# Patient Record
Sex: Female | Born: 1975 | Race: Black or African American | Hispanic: No | Marital: Single | Smoking: Current every day smoker
Health system: Southern US, Community
[De-identification: ages and names within clinical notes are randomized; demographics above are authoritative.]

---

## 2012-03-22 ENCOUNTER — Encounter (HOSPITAL_COMMUNITY): Payer: Self-pay | Admitting: Emergency Medicine

## 2012-03-22 ENCOUNTER — Emergency Department (INDEPENDENT_AMBULATORY_CARE_PROVIDER_SITE_OTHER)
Admission: EM | Admit: 2012-03-22 | Discharge: 2012-03-22 | Disposition: A | Discharge status: Home / Self Care | Payer: Self-pay | Source: Home / Self Care | Attending: Emergency Medicine | Admitting: Emergency Medicine

## 2012-03-22 DIAGNOSIS — K0530 Chronic periodontitis, unspecified: Secondary | ICD-10-CM

## 2012-03-22 MED ORDER — PENICILLIN V POTASSIUM 500 MG PO TABS: 500.0000 mg | ORAL_TABLET | Freq: Four times a day (QID) | ORAL | Status: AC

## 2012-03-22 MED ORDER — HYDROCODONE-ACETAMINOPHEN 5-325 MG PO TABS
1.0000 | ORAL_TABLET | Freq: Once | ORAL | Status: AC
  Administered 2012-03-22: 1 via ORAL

## 2012-03-22 MED ORDER — DICLOFENAC SODIUM 75 MG PO TBEC: 75.0000 mg | DELAYED_RELEASE_TABLET | Freq: Two times a day (BID) | ORAL | Status: AC

## 2012-03-22 MED ORDER — HYDROCODONE-ACETAMINOPHEN 5-325 MG PO TABS
ORAL_TABLET | ORAL | Status: AC
  Filled 2012-03-22: qty 1

## 2012-03-22 MED ORDER — HYDROCODONE-ACETAMINOPHEN 5-325 MG PO TABS: ORAL_TABLET | ORAL | Status: AC

## 2012-03-22 NOTE — ED Provider Notes (Signed)
Chief Complaint  Patient presents with  . Dental Pain    History of Present Illness:   The patient is a 37 year old female who has had a five-day history of pain in the left, lower wisdom tooth with radiation towards her ear and into her jaw. She's had some headache and swelling of the gingiva, no swelling externally. There's been no purulent drainage. It does hurt to chew and swallow, but she's had no difficulty breathing. She denies any fever, chills, or shortness of breath.  Review of Systems:  Other than noted above, the patient denies any of the following symptoms: Systemic:  No fever, chills, sweats or weight loss. ENT:  No headache, ear ache, sore throat, nasal congestion, facial pain, or swelling. Lymphatic:  No adenopathy. Lungs:  No coughing, wheezing or shortness of breath.  PMFSH:  Past medical history, family history, social history, meds, and allergies were reviewed.  Physical Exam:   Vital signs:  BP 142/96  Pulse 99  Temp 98.6 F (37 C) (Oral)  Resp 16  SpO2 100%  LMP 03/19/2012 General:  Alert, oriented, in no distress. ENT:  TMs and canals normal.  Nasal mucosa normal. Mouth exam:  Her left, lower wisdom tooth is inflamed and partially erupted. There is inflammation of the gingiva surrounding it. There is no purulent drainage or swelling of the gingiva. Her teeth are otherwise in fairly good repair. She's able to open her mouth fully. There is no swelling of the tongue or the floor the mouth. Neck:  No swelling or adenopathy. Lungs:  Breath sounds clear and equal bilaterally.  No wheezes, rales or rhonchi. Heart:  Regular rhythm.  No gallops or murmers. Skin:  Clear, warm and dry.  Course in Urgent Care Center:   She was given Norco 5/325 by mouth for pain.   Assessment:  The encounter diagnosis was Pericoronitis.  Plan:   1.  The following meds were prescribed:   New Prescriptions   DICLOFENAC (VOLTAREN) 75 MG EC TABLET    Take 1 tablet (75 mg total) by mouth  2 (two) times daily.   HYDROCODONE-ACETAMINOPHEN (NORCO/VICODIN) 5-325 MG PER TABLET    1 to 2 tabs every 4 to 6 hours as needed for pain.   PENICILLIN V POTASSIUM (VEETID) 500 MG TABLET    Take 1 tablet (500 mg total) by mouth 4 (four) times daily.   2.  The patient was instructed in symptomatic care and handouts were given. 3.  The patient was told to return if becoming worse in any way, if no better in 3 or 4 days, and given some red flag symptoms that would indicate earlier return, especially difficulty breathing. 4.  The patient was told to follow up with a dentist as soon as possible.    Reuben Likes, MD 03/22/12 2215

## 2012-03-22 NOTE — ED Notes (Signed)
Pt c/o dental pain x5 days Sx include: pain on left bottom molar, left ear pain,  Denies: fevers, vomiting, nauseas, diarrhea.  She is alert w/no signs of acute distress.

## 2013-02-02 ENCOUNTER — Encounter: Payer: Self-pay | Admitting: Internal Medicine

## 2013-02-02 ENCOUNTER — Ambulatory Visit: Payer: No Typology Code available for payment source | Attending: Internal Medicine | Admitting: Internal Medicine

## 2013-02-02 VITALS — BP 142/98 | HR 89 | Temp 98.5°F | Ht 65.0 in | Wt 215.0 lb

## 2013-02-02 DIAGNOSIS — I2699 Other pulmonary embolism without acute cor pulmonale: Secondary | ICD-10-CM

## 2013-02-02 LAB — COMPLETE METABOLIC PANEL WITH GFR
ALT: 9 U/L (ref 0–35)
BUN: 8 mg/dL (ref 6–23)
CO2: 29 mEq/L (ref 19–32)
Calcium: 9.8 mg/dL (ref 8.4–10.5)
Chloride: 103 mEq/L (ref 96–112)
Creat: 0.96 mg/dL (ref 0.50–1.10)
GFR, Est African American: 87 mL/min
GFR, Est Non African American: 76 mL/min
Glucose, Bld: 80 mg/dL (ref 70–99)

## 2013-02-02 LAB — LIPID PANEL: Total CHOL/HDL Ratio: 3 Ratio

## 2013-02-02 LAB — CBC WITH DIFFERENTIAL/PLATELET
Eosinophils Absolute: 0.1 10*3/uL (ref 0.0–0.7)
Eosinophils Relative: 2 % (ref 0–5)
HCT: 37.3 % (ref 36.0–46.0)
Lymphocytes Relative: 35 % (ref 12–46)
Lymphs Abs: 2.1 10*3/uL (ref 0.7–4.0)
MCH: 25.6 pg — ABNORMAL LOW (ref 26.0–34.0)
MCV: 80.4 fL (ref 78.0–100.0)
Monocytes Absolute: 0.4 10*3/uL (ref 0.1–1.0)
Monocytes Relative: 7 % (ref 3–12)
RBC: 4.64 MIL/uL (ref 3.87–5.11)
WBC: 5.9 10*3/uL (ref 4.0–10.5)

## 2013-02-02 LAB — TSH: TSH: 2.417 u[IU]/mL (ref 0.350–4.500)

## 2013-02-02 NOTE — Progress Notes (Unsigned)
Patient ID: Brittany Salas, female   DOB: 11-12-1975, 37 y.o.   MRN: [id number]   CC:  HPI: 37 yr old presents with a hx of bilateral PE last year  She took xarelto for 10 days and then could not afford to continue it,She has had intermittent SOB , and continue to feel short winded  She had an abnormal pap smear in [place] last year but has not had any follow up since due to lack of insurance  She has a family hx of blood clots in her mother  Her HG 3 months ago was 7.2, she denies heavy menstruation or melena ,     No Known Allergies History reviewed. No pertinent past medical history. Current Outpatient Prescriptions on File Prior to Visit  Medication Sig Dispense Refill  . diclofenac (VOLTAREN) 75 MG EC tablet Take 1 tablet (75 mg total) by mouth 2 (two) times daily.  20 tablet  0  . HYDROcodone-acetaminophen (NORCO/VICODIN) 5-325 MG per tablet 1 to 2 tabs every 4 to 6 hours as needed for pain.  20 tablet  0  . penicillin v potassium (VEETID) 500 MG tablet Take 1 tablet (500 mg total) by mouth 4 (four) times daily.  40 tablet  0   No current facility-administered medications on file prior to visit.   Family History  Problem Relation Age of Onset  . Asthma Mother   . Hypertension Maternal Aunt   . Diabetes Maternal Grandmother    History   Social History  . Marital Status: Single    Spouse Name: N/A    Number of Children: N/A  . Years of Education: N/A   Occupational History  . Not on file.   Social History Main Topics  . Smoking status: Current Every Day Smoker -- 0.25 packs/day    Types: Cigarettes  . Smokeless tobacco: Not on file  . Alcohol Use: Yes  . Drug Use: No  . Sexual Activity: Not on file   Other Topics Concern  . Not on file   Social History Narrative  . No narrative on file    Review of Systems  Constitutional: Negative for fever, chills, diaphoresis, activity change, appetite change and fatigue.  HENT: Negative for ear pain, nosebleeds,  congestion, facial swelling, rhinorrhea, neck pain, neck stiffness and ear discharge.   Eyes: Negative for pain, discharge, redness, itching and visual disturbance.  Respiratory: Negative for cough, choking, chest tightness, shortness of breath, wheezing and stridor.   Cardiovascular: Negative for chest pain, palpitations and leg swelling.  Gastrointestinal: Negative for abdominal distention.  Genitourinary: Negative for dysuria, urgency, frequency, hematuria, flank pain, decreased urine volume, difficulty urinating and dyspareunia.  Musculoskeletal: Negative for back pain, joint swelling, arthralgias and gait problem.  Neurological: Negative for dizziness, tremors, seizures, syncope, facial asymmetry, speech difficulty, weakness, light-headedness, numbness and headaches.  Hematological: Negative for adenopathy. Does not bruise/bleed easily.  Psychiatric/Behavioral: Negative for hallucinations, behavioral problems, confusion, dysphoric mood, decreased concentration and agitation.    Objective:   Filed Vitals:   02/02/13 0917  BP: 142/98  Pulse: 89  Temp: 98.5 F (36.9 C)    Physical Exam  Constitutional: Appears well-developed and well-nourished. No distress.  HENT: Normocephalic. External right and left ear normal. Oropharynx is clear and moist.  Eyes: Conjunctivae and EOM are normal. PERRLA, no scleral icterus.  Neck: Normal ROM. Neck supple. No JVD. No tracheal deviation. No thyromegaly.  CVS: RRR, S1/S2 +, no murmurs, no gallops, no carotid bruit.  Pulmonary: Effort and breath  sounds normal, no stridor, rhonchi, wheezes, rales.  Abdominal: Soft. BS +,  no distension, tenderness, rebound or guarding.  Musculoskeletal: Normal range of motion. No edema and no tenderness.  Lymphadenopathy: No lymphadenopathy noted, cervical, inguinal. Neuro: Alert. Normal reflexes, muscle tone coordination. No cranial nerve deficit. Skin: Skin is warm and dry. No rash noted. Not diaphoretic. No  erythema. No pallor.  Psychiatric: Normal mood and affect. Behavior, judgment, thought content normal.   No results found for this basename: WBC, HGB, HCT, MCV, PLT   No results found for this basename: CREATININE, BUN, NA, K, CL, CO2    No results found for this basename: HGBA1C   Lipid Panel  No results found for this basename: chol, trig, hdl, cholhdl, vldl, ldlcalc       Assessment and plan:   There are no active problems to display for this patient.      Bilateral PE Patients wants to repeat Ct scan before resuming blood thinner  Will check CTA and doppler b/l LE hypercoaguable panel  Gyne referral  For abnormal pap smear last year  Dentist referral provided because of a recent dental abscess  Follow up in 3-4 days after CT angio  The patient was given clear instructions to go to ER or return to medical center if symptoms don't improve, worsen or new problems develop. The patient verbalized understanding. The patient was told to call to get any lab results if not heard anything in the next week.

## 2013-02-02 NOTE — Progress Notes (Unsigned)
Pt is here to establish care. Pt complains of shortness of breath for 2 months. Recently visited [place] when the shortness of breathe first occurred. Had a chest xray performed. Pt requests to be referred to a dentist, a gynecologist and for another chest xray to be performed.

## 2013-02-03 LAB — VITAMIN D 25 HYDROXY (VIT D DEFICIENCY, FRACTURES): Vit D, 25-Hydroxy: 21 ng/mL — ABNORMAL LOW (ref 30–89)

## 2013-02-04 ENCOUNTER — Ambulatory Visit (HOSPITAL_COMMUNITY)
Admission: RE | Admit: 2013-02-04 | Discharge: 2013-02-04 | Disposition: A | Discharge status: Home / Self Care | Payer: No Typology Code available for payment source | Source: Ambulatory Visit | Attending: Internal Medicine | Admitting: Internal Medicine

## 2013-02-04 DIAGNOSIS — I2699 Other pulmonary embolism without acute cor pulmonale: Secondary | ICD-10-CM | POA: Insufficient documentation

## 2013-02-04 DIAGNOSIS — R0602 Shortness of breath: Secondary | ICD-10-CM | POA: Insufficient documentation

## 2013-02-04 LAB — HYPERCOAGULABLE PANEL, COMPREHENSIVE
Anticardiolipin IgG: 12 GPL U/mL (ref <23)
Anticardiolipin IgM: 5 MPL U/mL (ref <11)
Beta-2 Glyco I IgG: 6 G Units (ref <20)
Beta-2-Glycoprotein I IgA: 0 A Units (ref <20)
Lupus Anticoagulant: NOT DETECTED
PTT Lupus Anticoagulant: 35.3 secs (ref 28.0–43.0)
Protein C, Total: 67 % — ABNORMAL LOW (ref 72–160)
Protein S Total: 83 % (ref 60–150)

## 2013-02-04 MED ORDER — IOHEXOL 350 MG/ML SOLN
75.0000 mL | Freq: Once | INTRAVENOUS | Status: AC | PRN
  Administered 2013-02-04: 75 mL via INTRAVENOUS

## 2013-02-08 ENCOUNTER — Ambulatory Visit: Payer: No Typology Code available for payment source | Attending: Internal Medicine | Admitting: Internal Medicine

## 2013-02-08 ENCOUNTER — Encounter: Payer: Self-pay | Admitting: Internal Medicine

## 2013-02-08 VITALS — BP 137/94 | HR 92 | Temp 98.6°F | Resp 16 | Ht 63.0 in | Wt 218.0 lb

## 2013-02-08 DIAGNOSIS — I2699 Other pulmonary embolism without acute cor pulmonale: Secondary | ICD-10-CM | POA: Insufficient documentation

## 2013-02-08 MED ORDER — RIVAROXABAN 20 MG PO TABS: 20.0000 mg | ORAL_TABLET | Freq: Every day | ORAL | Status: DC

## 2013-02-08 NOTE — Progress Notes (Signed)
Patient ID: Brittany Salas, female   DOB: 20-Jul-1975, 37 y.o.   MRN: [id number] Patient Demographics  Brittany Salas, is a 37 y.o. female  WUJ:[id number]  [id number]  DOB - August 16, 1975  Chief Complaint  Patient presents with  . Follow-up        Subjective:   Brittany Salas is a 37 y.o. female here today for a follow up visit. Patient here to discuss the result of her CTA chest and to begin her Xarelto. She has no complaint today, her shortness of breath is better.  Patient has No headache, No chest pain, No abdominal pain - No Nausea, No new weakness tingling or numbness, No Cough - SOB.  ALLERGIES: No Known Allergies  PAST MEDICAL HISTORY: History reviewed. No pertinent past medical history.  MEDICATIONS AT HOME: Prior to Admission medications   Medication Sig Start Date End Date Taking? Authorizing Provider  diclofenac (VOLTAREN) 75 MG EC tablet Take 1 tablet (75 mg total) by mouth 2 (two) times daily. 03/22/12   Reuben Likes, MD  HYDROcodone-acetaminophen (NORCO/VICODIN) 5-325 MG per tablet 1 to 2 tabs every 4 to 6 hours as needed for pain. 03/22/12   Reuben Likes, MD  penicillin v potassium (VEETID) 500 MG tablet Take 1 tablet (500 mg total) by mouth 4 (four) times daily. 03/22/12   Reuben Likes, MD  Rivaroxaban (XARELTO) 20 MG TABS tablet Take 1 tablet (20 mg total) by mouth daily with supper. 02/08/13   Jeanann Lewandowsky, MD     Objective:   Filed Vitals:   02/08/13 0923  BP: 137/94  Pulse: 92  Temp: 98.6 F (37 C)  TempSrc: Oral  Resp: 16  Height: 5\' 3"  (1.6 m)  Weight: 218 lb (98.884 kg)  SpO2: 99%    Exam General appearance : Awake, alert, not in any distress. Speech Clear. Not toxic looking HEENT: Atraumatic and Normocephalic, pupils equally reactive to light and accomodation Neck: supple, no JVD. No cervical lymphadenopathy.  Chest:Good air entry bilaterally, no added sounds  CVS: S1 S2 regular, no murmurs.  Abdomen: Bowel sounds present, Non  tender and not distended with no gaurding, rigidity or rebound. Extremities: B/L Lower Ext shows no edema, both legs are warm to touch Neurology: Awake alert, and oriented X 3, CN II-XII intact, Non focal Skin:No Rash Wounds:N/A   Data Review   CBC  Recent Labs Lab 02/02/13 0941  WBC 5.9  HGB 11.9*  HCT 37.3  PLT 404*  MCV 80.4  MCH 25.6*  MCHC 31.9  RDW 20.1*  LYMPHSABS 2.1  MONOABS 0.4  EOSABS 0.1  BASOSABS 0.0    Chemistries    Recent Labs Lab 02/02/13 0941  NA 138  K 4.1  CL 103  CO2 29  GLUCOSE 80  BUN 8  CREATININE 0.96  CALCIUM 9.8  AST 10  ALT 9  ALKPHOS 88  BILITOT 0.4   ------------------------------------------------------------------------------------------------------------------ No results found for this basename: HGBA1C,  in the last 72 hours ------------------------------------------------------------------------------------------------------------------ No results found for this basename: CHOL, HDL, LDLCALC, TRIG, CHOLHDL, LDLDIRECT,  in the last 72 hours ------------------------------------------------------------------------------------------------------------------ No results found for this basename: TSH, T4TOTAL, FREET3, T3FREE, THYROIDAB,  in the last 72 hours ------------------------------------------------------------------------------------------------------------------ No results found for this basename: VITAMINB12, FOLATE, FERRITIN, TIBC, IRON, RETICCTPCT,  in the last 72 hours  Coagulation profile  No results found for this basename: INR, PROTIME,  in the last 168 hours    Assessment & Plan   1. Acute pulmonary embolism: Resolving on CT  angiogram of chest  - Rivaroxaban (XARELTO) 20 MG TABS tablet; Take 1 tablet (20 mg total) by mouth daily with supper.  Dispense: 30 tablet; Refill: 0  Patient was counseled extensively about smoking cessation Patient was counseled about nutrition and exercise  Follow up in 4 weeks or  when necessary   The patient was given clear instructions to go to ER or return to medical center if symptoms don't improve, worsen or new problems develop. The patient verbalized understanding. The patient was told to call to get lab results if they haven't heard anything in the next week.    Jeanann Lewandowsky, MD, MHA, FACP, FAAP [facility] [place], [place] [phone]   02/08/2013, 10:01 AM

## 2013-02-08 NOTE — Patient Instructions (Signed)
Pulmonary Embolus A pulmonary (lung) embolus (PE) is a blood clot that has traveled from another place in the body to the lung. Most clots come from deep veins in the legs or pelvis. PE is a dangerous and potentially life-threatening condition that can be treated if identified. CAUSES Blood clots form in a vein for different reasons. Usually several things cause blood clots. They include:  The flow of blood slows down.  The inside of the vein is damaged in some way.  The person has a condition that makes the blood clot more easily. These conditions may include:  Older age (especially over 75 years old).  Having a history of blood clots.  Having major or lengthy surgery. Hip surgery is particularly high-risk.  Breaking a hip or leg.  Sitting or lying still for a long time.  Cancer or cancer treatment.  Having a long, thin tube (catheter) placed inside a vein during a medical procedure.  Being overweight (obese).  Pregnancy and childbirth.  Medicines with estrogen.  Smoking.  Other circulation or heart problems. SYMPTOMS  The symptoms of a PE usually start suddenly and include:  Shortness of breath.  Coughing.  Coughing up blood or blood-tinged mucus (phlegm).  Chest pain. Pain is often worse with deep breaths.  Rapid heartbeat. DIAGNOSIS  If a PE is suspected, your caregiver will take a medical history and carry out a physical exam. Your caregiver will check for the risk factors listed above. Tests that also may be required include:  Blood tests, including studies of the clotting properties of your blood.  Imaging tests. Ultrasound, CT, MRI, and other tests can all be used to see if you have clots in your legs or lungs. If you have a clot in your legs and have breathing or chest problems, your caregiver may conclude that you have a clot in your lungs. Further lung tests may not be needed.  Electrocardiography can look for heart strain from blood clots in the  lungs. PREVENTION   Exercise the legs regularly. Take a brisk 30 minute walk every day.  Maintain a weight that is appropriate for your height.  Avoid sitting or lying in bed for long periods of time without moving your legs.  Women, particularly those over the age of 35, should consider the risks and benefits of taking estrogen medicines, including birth control pills.  Do not smoke, especially if you take estrogen medicines.  Long-distance travel can increase your risk. You should exercise your legs by walking or pumping the muscles every hour.  In hospital prevention:  Your caregiver will assess your need for preventive PE care (prophylaxis) when you are admitted to the hospital. If you are having surgery, your surgeon will assess you the day of or day after surgery.  Prevention may include medical and nonmedical measures. TREATMENT   The most common treatment for a PE is blood thinning (anticoagulant) medicine, which reduces the blood's tendency to clot. Anticoagulants can stop new blood clots from forming and old ones from growing. They cannot dissolve existing clots. Your body does this by itself over time. Anticoagulants can be given by mouth, by intravenous (IV) access, or by injection. Your caregiver will determine the best program for you.  Less commonly, clot-dissolving drugs (thrombolytics) are used to dissolve a PE. They carry a high risk of bleeding, so they are used mainly in severe cases.  Very rarely, a blood clot in the leg needs to be removed surgically.  If you are unable to   take anticoagulants, your caregiver may arrange for you to have a filter placed in a main vein in your abdomen. This filter prevents clots from traveling to your lungs. HOME CARE INSTRUCTIONS   Take all medicines prescribed by your caregiver. Follow the directions carefully.  Warfarin. Most people will continue taking warfarin after hospital discharge. Your caregiver will advise you on the  length of treatment (usually 3 6 months, sometimes lifelong).  Too much and too little warfarin are both dangerous. Too much warfarin increases the risk of bleeding. Too little warfarin continues to allow the risk for blood clots. While taking warfarin, you will need to have regular blood tests to measure your blood clotting time. These blood tests usually include both the prothrombin time (PT) and International Normalized Ratio (INR) tests. The PT and INR results allow your caregiver to adjust your dose of warfarin. The dose can change for many reasons. It is critically important that you take warfarin exactly as prescribed, and that you have your PT and INR levels drawn exactly as directed.  Many foods, especially foods high in vitamin K can interfere with warfarin and affect the PT and INR results. Foods high in vitamin K include spinach, kale, broccoli, cabbage, collard and turnip greens, brussels sprouts, peas, cauliflower, seaweed, and parsley as well as beef and pork liver, green tea, and soybean oil. You should eat a consistent amount of foods high in vitamin K. Avoid major changes in your diet, or notify your caregiver before changing your diet. Arrange a visit with a dietitian to answer your questions.  Many medicines can interfere with warfarin and affect the PT and INR results. You must tell your caregiver about any and all medicines you take, this includes all vitamins and supplements. Be especially cautious with aspirin and anti-inflammatory medicines. Ask your caregiver before taking these. Do not take or discontinue any prescribed or over-the-counter medicine except on the advice of your caregiver or pharmacist.  Warfarin can have side effects, such as excessive bruising or bleeding. You will need to hold pressure over cuts for longer than usual.  Alcohol can change the body's ability to handle warfarin. It is best to avoid alcoholic drinks or consume only very small amounts while taking  warfarin. Notify your caregiver if you change your alcohol intake.  Notify your dentist or other caregivers before procedures.  Avoid contact sports.  Wear a medical alert bracelet or carry a medical alert card.  Ask your caregiver how soon you can go back to normal activities. Not being active can lead to new clots. Ask for a list of what you should and should not do.  Compression stockings. These are tight elastic stockings that apply pressure to the lower legs. This can help keep the blood in the legs from clotting. You may need to wear compressions stockings at home to help prevent clots.  Smoking. If you smoke, quit. Ask your caregiver for help with quitting smoking.  Learn as much as you can about PE. Educating yourself can help prevent PE from reoccurring. SEEK MEDICAL CARE IF:   You notice a rapid heartbeat.  You feel weaker or more tired than usual.  You feel faint.  You notice increased bruising.  Your symptoms are not getting better in the time expected.  You are having side effects of medicine. SEEK IMMEDIATE MEDICAL CARE IF:   You have chest pain.  You have trouble breathing.  You have new or increased swelling or pain in one leg.  You   cough up blood.  You notice blood in vomit, in a bowel movement, or in urine.  You have an oral temperature above 102 F (38.9 C), not controlled by medicine. You may have another PE. A blood clot in the lungs is a medical emergency. Call your local emergency services (911 in [place] to get to the nearest hospital or clinic. Do not drive yourself. MAKE SURE YOU:   Understand these instructions.  Will watch your condition.  Will get help right away if you are not doing well or get worse. Document Released: 02/29/2000 Document Revised: 09/02/2011 Document Reviewed: 09/04/2008 [employer] Patient Information 2014 [employer].  

## 2013-02-08 NOTE — Progress Notes (Signed)
Pt reports that she had blood clots in both lungs causing her SOB her symptoms has not changed.

## 2013-02-14 ENCOUNTER — Ambulatory Visit: Payer: Self-pay

## 2013-03-23 ENCOUNTER — Encounter: Payer: Self-pay | Admitting: Obstetrics & Gynecology

## 2013-03-30 ENCOUNTER — Other Ambulatory Visit: Payer: Self-pay | Admitting: Internal Medicine

## 2013-04-20 ENCOUNTER — Encounter: Payer: Self-pay | Admitting: Obstetrics & Gynecology

## 2013-04-20 ENCOUNTER — Encounter (INDEPENDENT_AMBULATORY_CARE_PROVIDER_SITE_OTHER): Payer: Self-pay | Admitting: Obstetrics & Gynecology

## 2013-04-22 ENCOUNTER — Encounter: Payer: Self-pay | Admitting: Obstetrics & Gynecology

## 2013-04-22 NOTE — Progress Notes (Signed)
Patient ID: Brittany BealFrancine Scow, female   DOB: [birth year], 38 y.o.   MRN: [id number] Pt not seen referred to free PAP clinic

## 2013-04-28 ENCOUNTER — Other Ambulatory Visit: Payer: Self-pay | Admitting: Internal Medicine

## 2013-05-11 ENCOUNTER — Ambulatory Visit: Payer: Self-pay | Admitting: Internal Medicine

## 2013-06-28 ENCOUNTER — Other Ambulatory Visit: Payer: Self-pay | Admitting: Internal Medicine

## 2013-06-28 DIAGNOSIS — I2699 Other pulmonary embolism without acute cor pulmonale: Secondary | ICD-10-CM

## 2013-08-07 ENCOUNTER — Other Ambulatory Visit: Payer: Self-pay | Admitting: Internal Medicine

## 2014-07-30 IMAGING — CT CT ANGIO CHEST
2 of 9 series · 19 of 46 positions shown · IV contrast (APPLIED)
Comparison: None.

CLINICAL DATA: Shortness of breath. History of prior pulmonary
embolism.

EXAM:
CT ANGIOGRAPHY CHEST WITH CONTRAST
TECHNIQUE: Multidetector CT imaging of the chest was performed using the
standard protocol during bolus administration of intravenous
contrast. Multiplanar CT image reconstructions including MIPs were
obtained to evaluate the vascular anatomy.
CONTRAST:  75mL OMNIPAQUE IOHEXOL 350 MG/ML SOLN

[Series 5: thins · axial · 0.60mm/px · z∈[+1212,+1410]mm · 16 of 226 slices shown]
[im 14/226  lung]
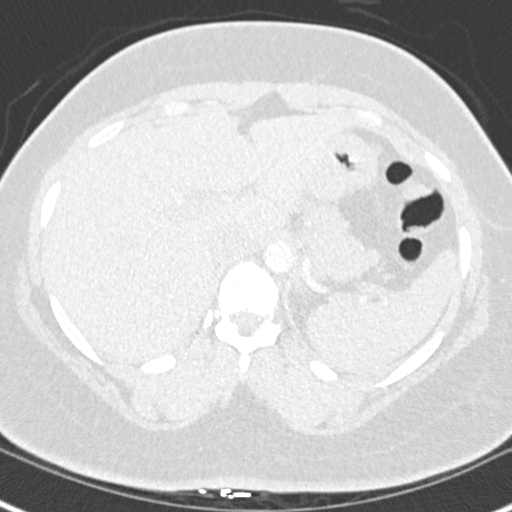
[im 27/226  soft-tissue]
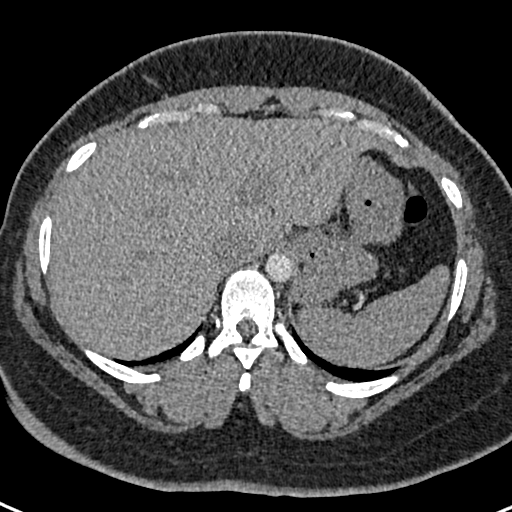
[im 40/226  lung]
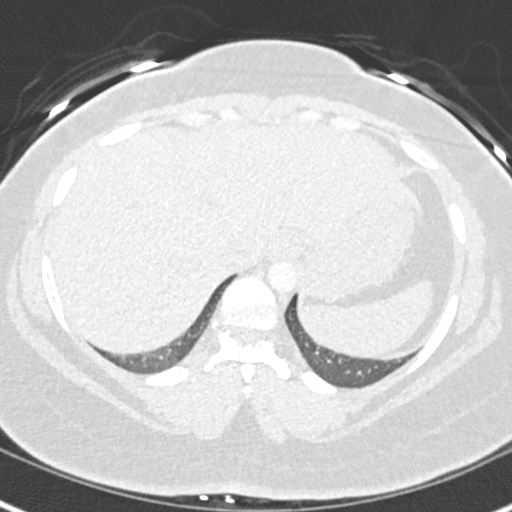
[im 53/226  soft-tissue]
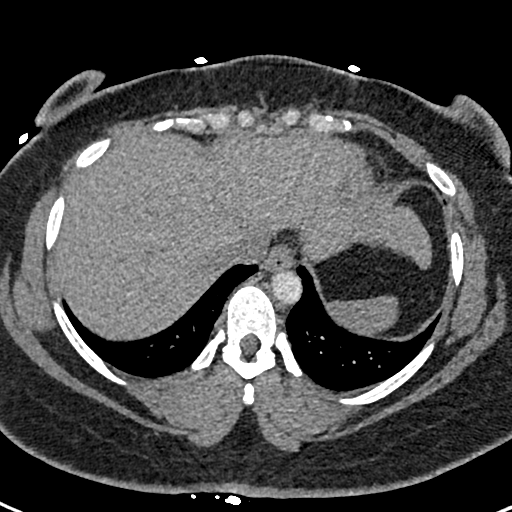
[im 67/226  lung]
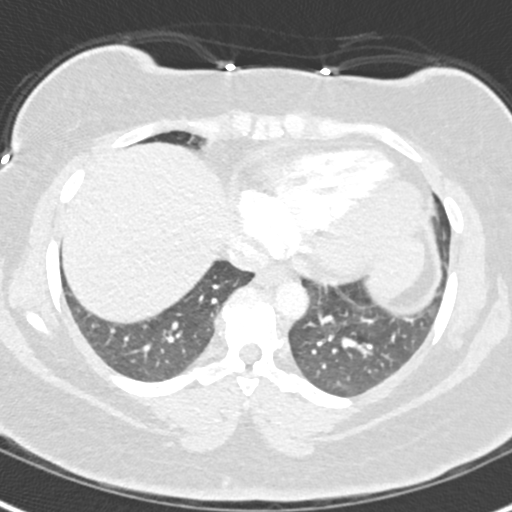
[im 80/226  soft-tissue]
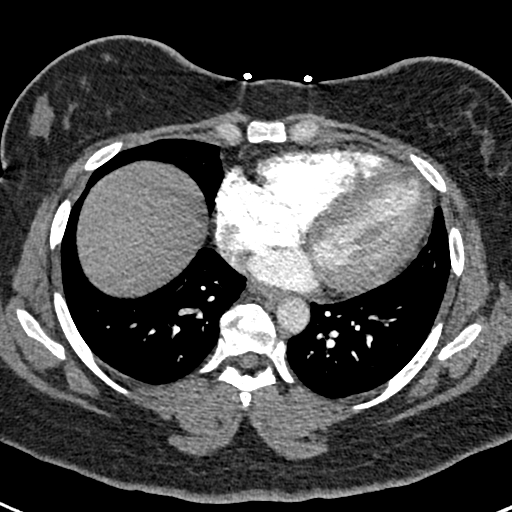
[im 93/226  lung]
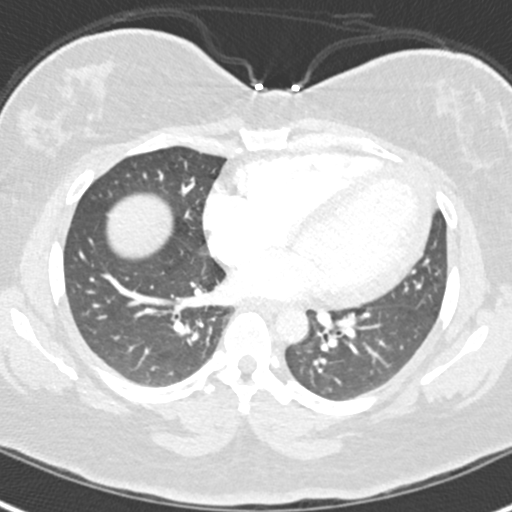
[im 106/226  soft-tissue]
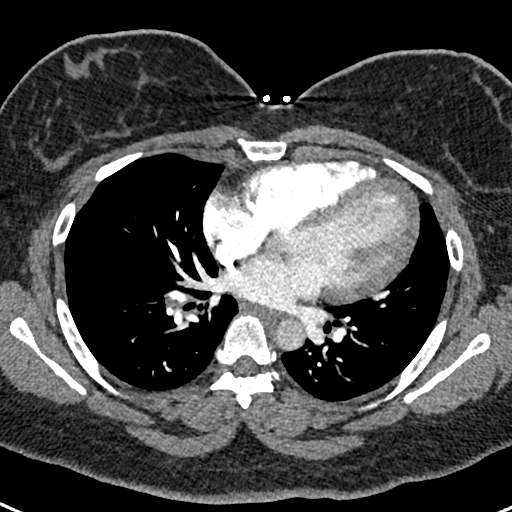
[im 120/226  lung]
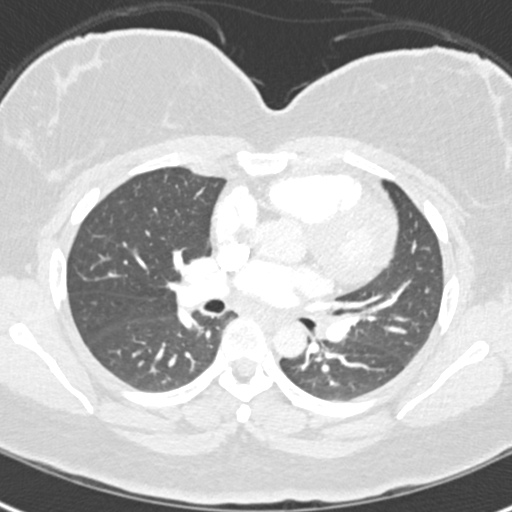
[im 133/226  soft-tissue]
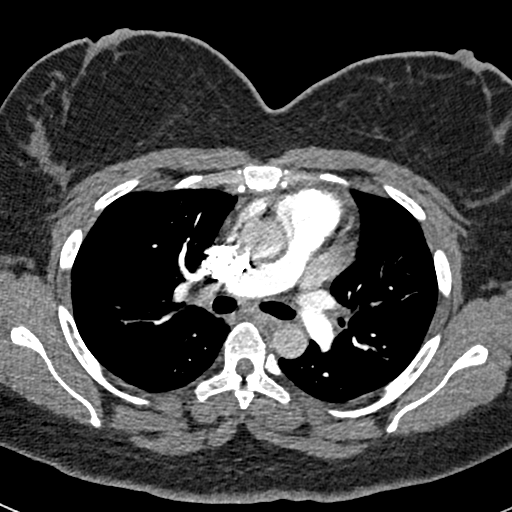
[im 146/226  lung]
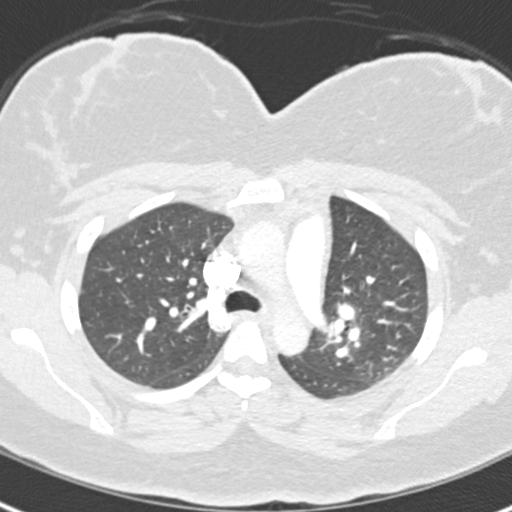
[im 159/226  soft-tissue]
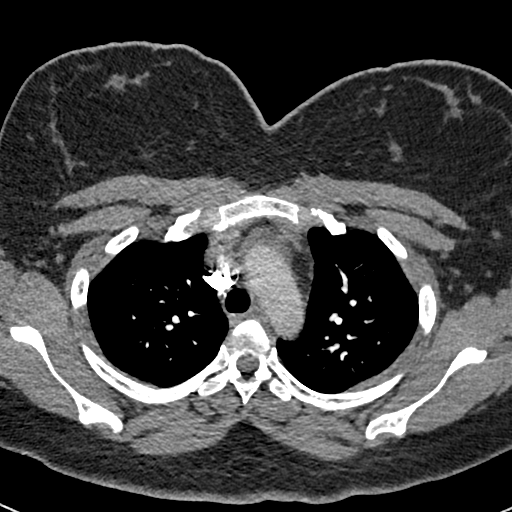
[im 173/226  lung]
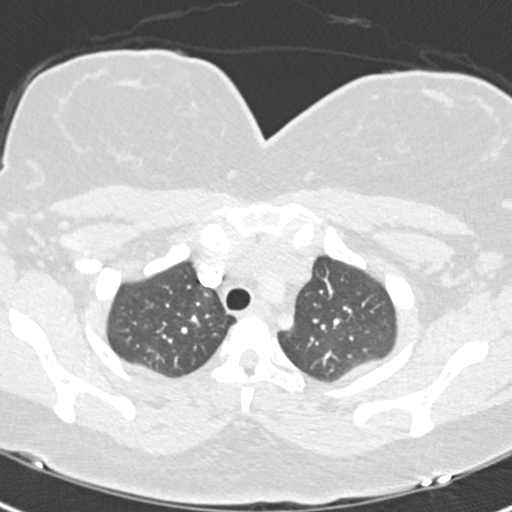
[im 186/226  soft-tissue]
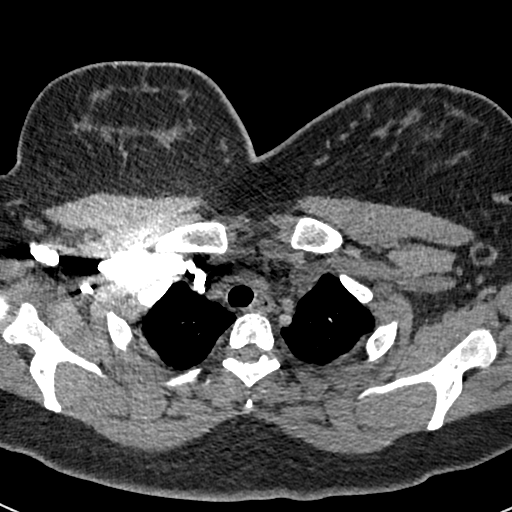
[im 199/226  lung]
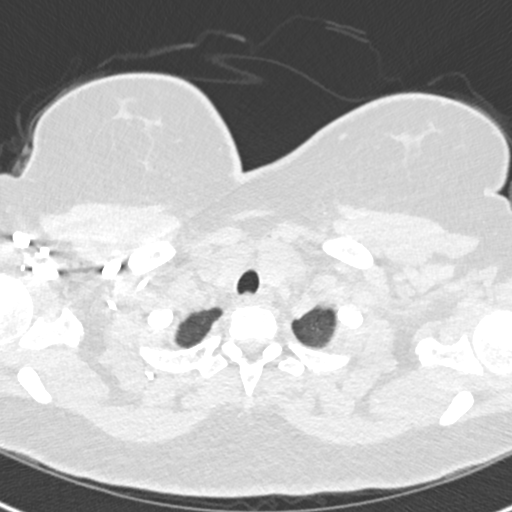
[im 212/226  soft-tissue]
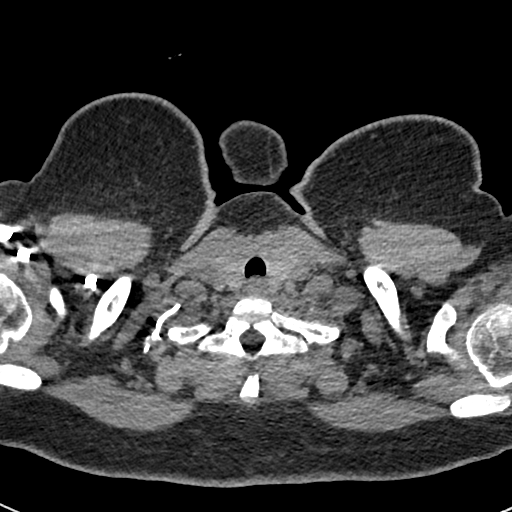

[Series 7: coronal mpr · coronal · 0.59mm/px · 3 of 107 slices shown]
[im 27/107  soft-tissue]
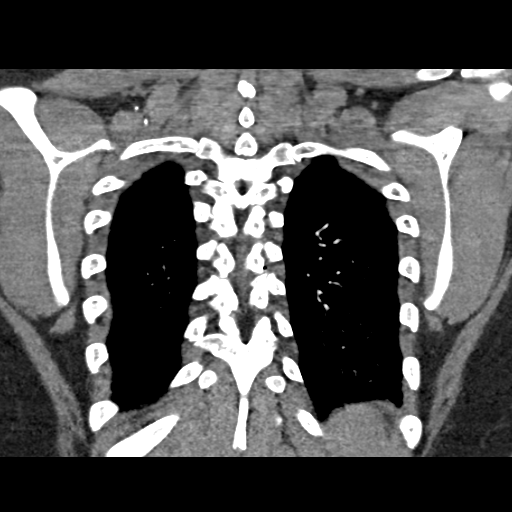
[im 54/107  soft-tissue]
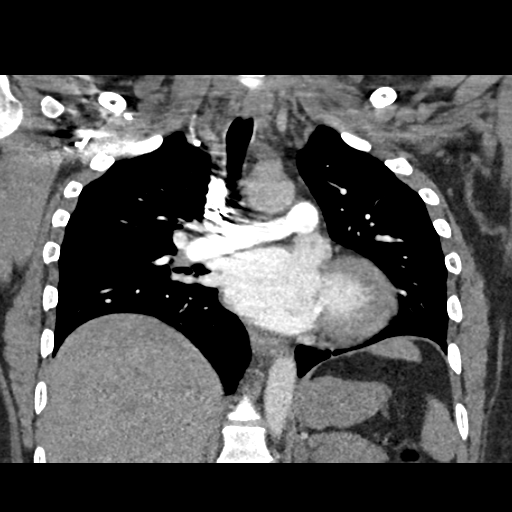
[im 80/107  soft-tissue]
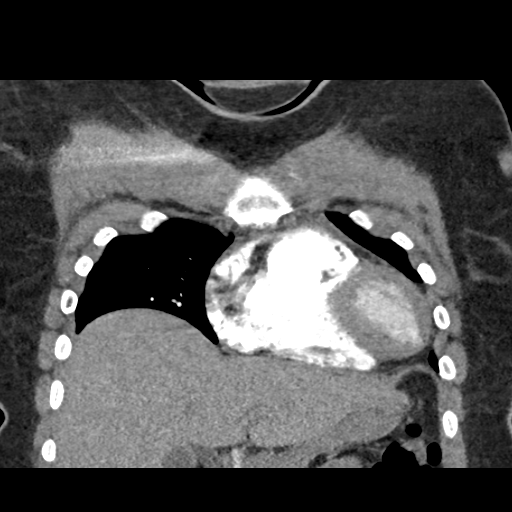

[19 of 46 positions shown; findings below may reference images not displayed]

FINDINGS: The chest wall is unremarkable. No breast masses, supraclavicular or
axillary adenopathy. Small scattered lymph nodes are noted. The
thyroid gland is unremarkable. The bony thorax is intact.

The heart is borderline enlarged for age. No mediastinal or hilar
mass or adenopathy. No pericardial effusion. The aorta is normal in
caliber. No dissection. The esophagus is grossly normal.

The pulmonary arterial tree is fairly well opacified. There are a
linear peripheral filling defects in both the right and left lower
lobe pulmonary arteries likely representing the sequela of previous
PUD with probable fibrin bands/resolving clot. I do not see any
findings that look like new/acute pulmonary embolism.

The lungs are clear. No pleural effusion or pulmonary nodule. No
bronchiectasis or interstitial lung disease. The tracheobronchial
tree is unremarkable.

The upper abdomen is on.

Review of the MIP images confirms the above findings.
IMPRESSION: Resolving bilateral pulmonary emboli with residual fibrin bands or
wall adherent chronic clot. No findings to suggest new/acute
pulmonary emboli.

No acute pulmonary findings.

Normal thoracic aorta.
